# Patient Record
Sex: Male | Born: 2000 | Race: White | Hispanic: No | Marital: Single | Smoking: Never smoker
Health system: Southern US, Community
[De-identification: ages and names within clinical notes are randomized; demographics above are authoritative.]

## PROBLEM LIST (undated history)

## (undated) DIAGNOSIS — R109 Unspecified abdominal pain: Secondary | ICD-10-CM

## (undated) HISTORY — DX: Unspecified abdominal pain: R10.9

---
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
## 2022-01-16 DIAGNOSIS — L299 Pruritus, unspecified: Secondary | ICD-10-CM | POA: Diagnosis not present

## 2022-01-16 DIAGNOSIS — Z8349 Family history of other endocrine, nutritional and metabolic diseases: Secondary | ICD-10-CM | POA: Diagnosis not present

## 2022-01-16 DIAGNOSIS — L658 Other specified nonscarring hair loss: Secondary | ICD-10-CM | POA: Diagnosis not present

## 2022-01-16 DIAGNOSIS — L639 Alopecia areata, unspecified: Secondary | ICD-10-CM | POA: Diagnosis not present

## 2022-01-16 DIAGNOSIS — R21 Rash and other nonspecific skin eruption: Secondary | ICD-10-CM | POA: Diagnosis not present

## 2022-01-19 DIAGNOSIS — M958 Other specified acquired deformities of musculoskeletal system: Secondary | ICD-10-CM | POA: Diagnosis not present

## 2022-01-19 DIAGNOSIS — M25561 Pain in right knee: Secondary | ICD-10-CM | POA: Diagnosis not present

## 2022-01-19 DIAGNOSIS — M1711 Unilateral primary osteoarthritis, right knee: Secondary | ICD-10-CM | POA: Diagnosis not present

## 2022-01-19 DIAGNOSIS — M2241 Chondromalacia patellae, right knee: Secondary | ICD-10-CM | POA: Diagnosis not present

## 2022-01-23 DIAGNOSIS — S83011D Lateral subluxation of right patella, subsequent encounter: Secondary | ICD-10-CM | POA: Diagnosis not present

## 2022-01-23 DIAGNOSIS — M6281 Muscle weakness (generalized): Secondary | ICD-10-CM | POA: Diagnosis not present

## 2022-01-23 DIAGNOSIS — R262 Difficulty in walking, not elsewhere classified: Secondary | ICD-10-CM | POA: Diagnosis not present

## 2022-01-23 DIAGNOSIS — M25661 Stiffness of right knee, not elsewhere classified: Secondary | ICD-10-CM | POA: Diagnosis not present

## 2022-01-27 DIAGNOSIS — S83281D Other tear of lateral meniscus, current injury, right knee, subsequent encounter: Secondary | ICD-10-CM | POA: Diagnosis not present

## 2022-02-04 DIAGNOSIS — S83011D Lateral subluxation of right patella, subsequent encounter: Secondary | ICD-10-CM | POA: Diagnosis not present

## 2022-02-04 DIAGNOSIS — R262 Difficulty in walking, not elsewhere classified: Secondary | ICD-10-CM | POA: Diagnosis not present

## 2022-02-04 DIAGNOSIS — M25661 Stiffness of right knee, not elsewhere classified: Secondary | ICD-10-CM | POA: Diagnosis not present

## 2022-02-04 DIAGNOSIS — M6281 Muscle weakness (generalized): Secondary | ICD-10-CM | POA: Diagnosis not present

## 2022-03-17 DIAGNOSIS — S83011D Lateral subluxation of right patella, subsequent encounter: Secondary | ICD-10-CM | POA: Diagnosis not present

## 2022-06-10 DIAGNOSIS — L0889 Other specified local infections of the skin and subcutaneous tissue: Secondary | ICD-10-CM | POA: Diagnosis not present

## 2022-06-24 DIAGNOSIS — B86 Scabies: Secondary | ICD-10-CM | POA: Diagnosis not present

## 2022-06-24 DIAGNOSIS — L409 Psoriasis, unspecified: Secondary | ICD-10-CM | POA: Diagnosis not present

## 2022-07-24 DIAGNOSIS — H6642 Suppurative otitis media, unspecified, left ear: Secondary | ICD-10-CM | POA: Diagnosis not present

## 2022-07-24 DIAGNOSIS — J069 Acute upper respiratory infection, unspecified: Secondary | ICD-10-CM | POA: Diagnosis not present

## 2022-08-19 DIAGNOSIS — Z7251 High risk heterosexual behavior: Secondary | ICD-10-CM | POA: Diagnosis not present

## 2022-08-19 DIAGNOSIS — Z113 Encounter for screening for infections with a predominantly sexual mode of transmission: Secondary | ICD-10-CM | POA: Diagnosis not present

## 2022-08-19 DIAGNOSIS — L989 Disorder of the skin and subcutaneous tissue, unspecified: Secondary | ICD-10-CM | POA: Diagnosis not present

## 2022-11-25 DIAGNOSIS — L659 Nonscarring hair loss, unspecified: Secondary | ICD-10-CM | POA: Diagnosis not present

## 2022-11-25 DIAGNOSIS — R21 Rash and other nonspecific skin eruption: Secondary | ICD-10-CM | POA: Diagnosis not present

## 2022-11-25 DIAGNOSIS — L409 Psoriasis, unspecified: Secondary | ICD-10-CM | POA: Diagnosis not present

## 2022-12-24 DIAGNOSIS — L280 Lichen simplex chronicus: Secondary | ICD-10-CM | POA: Diagnosis not present
